# Patient Record
Sex: Female | Born: 1965 | Hispanic: Yes
Health system: Northeastern US, Academic
[De-identification: ages and names within clinical notes are randomized; demographics above are authoritative.]

---

## 2013-08-24 ENCOUNTER — Emergency Department (HOSPITAL_COMMUNITY): Payer: No Typology Code available for payment source

## 2013-08-24 ENCOUNTER — Emergency Department (HOSPITAL_COMMUNITY)
Admission: EM | Admit: 2013-08-24 | Discharge: 2013-08-24 | Disposition: A | Discharge status: Home / Self Care | Payer: No Typology Code available for payment source | Attending: Emergency Medicine | Admitting: Emergency Medicine

## 2013-08-24 ENCOUNTER — Encounter (HOSPITAL_COMMUNITY): Payer: Self-pay | Admitting: *Deleted

## 2013-08-24 DIAGNOSIS — R209 Unspecified disturbances of skin sensation: Secondary | ICD-10-CM | POA: Insufficient documentation

## 2013-08-24 DIAGNOSIS — M549 Dorsalgia, unspecified: Secondary | ICD-10-CM

## 2013-08-24 DIAGNOSIS — R51 Headache: Secondary | ICD-10-CM | POA: Insufficient documentation

## 2013-08-24 DIAGNOSIS — M7989 Other specified soft tissue disorders: Secondary | ICD-10-CM | POA: Insufficient documentation

## 2013-08-24 DIAGNOSIS — M545 Low back pain, unspecified: Secondary | ICD-10-CM | POA: Insufficient documentation

## 2013-08-24 DIAGNOSIS — M25539 Pain in unspecified wrist: Secondary | ICD-10-CM | POA: Insufficient documentation

## 2013-08-24 DIAGNOSIS — Y939 Activity, unspecified: Secondary | ICD-10-CM | POA: Insufficient documentation

## 2013-08-24 DIAGNOSIS — Y92410 Unspecified street and highway as the place of occurrence of the external cause: Secondary | ICD-10-CM | POA: Insufficient documentation

## 2013-08-24 MED ORDER — METHOCARBAMOL 500 MG PO TABS: 500.0000 mg | ORAL_TABLET | Freq: Two times a day (BID) | ORAL | Status: AC | PRN

## 2013-08-24 MED ORDER — HYDROCODONE-ACETAMINOPHEN 5-325 MG PO TABS: 1.0000 | ORAL_TABLET | ORAL | Status: AC | PRN

## 2013-08-24 NOTE — ED Provider Notes (Signed)
Medical screening examination/treatment/procedure(s) were performed by non-physician practitioner and as supervising physician I was immediately available for consultation/collaboration.  Flint MelterElliott L Kentley Blyden, MD 08/24/13 [phone]

## 2013-08-24 NOTE — Discharge Instructions (Signed)
Take the prescribed medication as directed.  Do not drive while taking vicodin. You will likely continue to be sore for the next several days-- this is normal.  Wear wrist splint as directed, may adjust for comfort. You may wish to ice and elevate wrist to help with pain and swelling.  Follow up with hand surgery, Dr. Amanda PeaGramig if no improvement of wrist symptoms within 1 week. Return to the ED for new or worsening symptoms.
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
## 2020-01-18 ENCOUNTER — Inpatient Hospital Stay: Admit: 2020-01-18 | Discharge: 2020-01-19 | Payer: MEDICAID

## 2020-01-18 ENCOUNTER — Encounter: Admit: 2020-01-18 | Payer: PRIVATE HEALTH INSURANCE | Primary: Internal Medicine

## 2020-01-18 ENCOUNTER — Emergency Department: Admit: 2020-01-18 | Payer: PRIVATE HEALTH INSURANCE | Primary: Internal Medicine

## 2020-01-18 DIAGNOSIS — E669 Obesity, unspecified: Secondary | ICD-10-CM

## 2020-01-18 DIAGNOSIS — R102 Pelvic and perineal pain: Secondary | ICD-10-CM

## 2020-01-18 DIAGNOSIS — F17200 Nicotine dependence, unspecified, uncomplicated: Secondary | ICD-10-CM

## 2020-01-18 DIAGNOSIS — G43909 Migraine, unspecified, not intractable, without status migrainosus: Secondary | ICD-10-CM

## 2020-01-18 DIAGNOSIS — K0889 Other specified disorders of teeth and supporting structures: Principal | ICD-10-CM

## 2020-01-18 DIAGNOSIS — I8390 Asymptomatic varicose veins of unspecified lower extremity: Secondary | ICD-10-CM

## 2020-01-18 DIAGNOSIS — N920 Excessive and frequent menstruation with regular cycle: Secondary | ICD-10-CM

## 2020-01-18 MED ORDER — IBUPROFEN 800 MG TABLET
800 mg | Freq: Once | ORAL | Status: CP
Start: 2020-01-18 — End: ?
  Administered 2020-01-19: 01:00:00 800 mg via ORAL

## 2020-01-18 MED ORDER — CEPHALEXIN 500 MG CAPSULE
500 mg | Freq: Once | ORAL | Status: CP
Start: 2020-01-18 — End: ?
  Administered 2020-01-19: 04:00:00 500 mg via ORAL

## 2020-01-18 MED ORDER — CEPHALEXIN 500 MG CAPSULE
500 mg | ORAL_CAPSULE | Freq: Three times a day (TID) | ORAL | 1 refills | Status: AC
Start: 2020-01-18 — End: ?

## 2020-01-19 DIAGNOSIS — F17210 Nicotine dependence, cigarettes, uncomplicated: Secondary | ICD-10-CM

## 2020-01-19 DIAGNOSIS — R220 Localized swelling, mass and lump, head: Secondary | ICD-10-CM

## 2020-01-19 DIAGNOSIS — Z6841 Body Mass Index (BMI) 40.0 and over, adult: Secondary | ICD-10-CM

## 2020-01-19 DIAGNOSIS — H9202 Otalgia, left ear: Secondary | ICD-10-CM

## 2020-01-19 DIAGNOSIS — E669 Obesity, unspecified: Secondary | ICD-10-CM

## 2020-01-19 LAB — BASIC METABOLIC PANEL
BKR ANION GAP: 14 (ref 7–17)
BKR BLOOD UREA NITROGEN: 14 mg/dL (ref 6–20)
BKR BUN / CREAT RATIO: 12.7 (ref 8.0–23.0)
BKR CALCIUM: 9.4 mg/dL (ref 8.8–10.2)
BKR CHLORIDE: 98 mmol/L (ref 98–107)
BKR CO2: 26 mmol/L (ref 20–30)
BKR CREATININE: 1.1 mg/dL (ref 0.40–1.30)
BKR EGFR (AFR AMER): >60 mL/min/{1.73_m2} (ref >60)
BKR EGFR (NON AFRICAN AMERICAN): 52 mL/min/{1.73_m2} (ref >60)
BKR GLUCOSE: 135 mg/dL — ABNORMAL HIGH (ref 70–100)
BKR POTASSIUM: 3.6 mmol/L (ref 3.3–5.1)
BKR SODIUM: 138 mmol/L (ref 136–144)

## 2020-01-19 LAB — CBC WITH AUTO DIFFERENTIAL
BKR WAM ABSOLUTE IMMATURE GRANULOCYTES: 0.1 x 1000/ÂµL (ref 0.0–0.3)
BKR WAM ABSOLUTE LYMPHOCYTE COUNT: 2.4 x 1000/ÂµL (ref 1.0–4.0)
BKR WAM ABSOLUTE NRBC: 0 x 1000/ÂµL (ref 0.0–0.0)
BKR WAM ANALYZER ANC: 6.5 x 1000/ÂµL (ref 1.0–11.0)
BKR WAM BASOPHIL ABSOLUTE COUNT: 0 x 1000/ÂµL (ref 0.0–0.0)
BKR WAM BASOPHILS: 0.3 % (ref 0.0–4.0)
BKR WAM EOSINOPHIL ABSOLUTE COUNT: 0.2 x 1000/ÂµL (ref 0.0–1.0)
BKR WAM EOSINOPHILS: 2.4 % (ref 0.0–7.0)
BKR WAM HEMATOCRIT: 44 % (ref 37.0–52.0)
BKR WAM HEMOGLOBIN: 14.5 g/dL (ref 12.0–18.0)
BKR WAM IMMATURE GRANULOCYTES: 0.6 % (ref 0.0–3.0)
BKR WAM LYMPHOCYTES: 23.8 % (ref 8.0–49.0)
BKR WAM MCH (PG): 30.5 pg (ref 27.0–31.0)
BKR WAM MCHC: 33 g/dL (ref 31.0–36.0)
BKR WAM MCV: 92.4 fL (ref 78.0–94.0)
BKR WAM MONOCYTE ABSOLUTE COUNT: 0.7 x 1000/ÂµL (ref 0.0–2.0)
BKR WAM MONOCYTES: 6.7 % (ref 4.0–15.0)
BKR WAM MPV: 9.7 fL (ref 6.0–11.0)
BKR WAM NEUTROPHILS: 66.2 % (ref 37.0–84.0)
BKR WAM NUCLEATED RED BLOOD CELLS: 0 % (ref 0.0–1.0)
BKR WAM PLATELETS: 282 x1000/ÂµL (ref 140–440)
BKR WAM RDW-CV: 13.3 % (ref 11.5–14.5)
BKR WAM RED BLOOD CELL COUNT: 4.8 M/ÂµL (ref 3.8–5.9)
BKR WAM WHITE BLOOD CELL COUNT: 9.9 x1000/ÂµL (ref 4.0–10.0)

## 2020-01-19 NOTE — ED Triage Note
Provider in Triage Note10 y.o. year old female  presents with tooth pain, facial pain, otalgia, left sided facial swelling for 3 days.  AfebrilePhysical Exam: obvious left sided facial swelling, erythema/indurationOrders placed in triage: yes imaging, labs Disposition: Main Emergency Department for further evaluation.Janet Escalante Koziel6/22/2021 8:30 PM

## 2020-01-19 NOTE — ED Notes
9:09 PM Pt presents to ED r/t left sided dental pain/swelling x 3 days. Pt is a+o X4, speaking in clear and full sentences. Pt reports she has a wisdom tooth on her upper left gum that has been causing her pain and swelling. Pt reports she bit down on a chicken bone and had extreme pain. Pt presents with left sided facial pain and left sided ear pain. Pt reports her left ear feels like she is underwater. Pt denies fevers/chills. Pt medicated per MAR. Pending provider eval.Past Medical History: Diagnosis Date ? Menorrhagia  ? Migraine headache  ? Obesity (BMI 30-39.9)  ? Pelvic pain   since 2010 or 2011 ? Smoker  ? Varicose vein of leg  9:34 PM Pt aware of pending [facility] scan9:48 PM MD at bedside for eval10:38 PM Pt back from [place], pending results

## 2020-01-28 NOTE — ED Provider Notes
HistoryChief Complaint Patient presents with ? Dental Problem   Presents to ED for eval of left sided dental pain and ear pain for 3 days. + facial swelling. Denies fever. ? Facial Swelling  The history is provided by the patient. OtherThis is a new problem. The current episode started more than 2 days ago. The problem occurs constantly. The problem has not changed since onset.Pertinent negatives include no chest pain and no abdominal pain. Nothing aggravates the symptoms. Nothing relieves the symptoms. She has tried nothing for the symptoms.  Past Medical History: Diagnosis Date ? Menorrhagia  ? Migraine headache  ? Obesity (BMI 30-39.9)  ? Pelvic pain   since 2010 or 2011 ? Smoker  ? Varicose vein of leg  Past Surgical History: Procedure Laterality Date ? BACK SURGERY   ? HYSTERECTOMY   ? LEFT OOPHORECTOMY    laparoscopy, ovarian torsion ? LUMBAR FUSION  09/11/2005 Family History Problem Relation Age of Onset ? Prostate cancer Father       metastasis to bone ? Breast cancer Neg Hx  ? Colon cancer Neg Hx  ? Ovarian cancer Neg Hx  Social History Socioeconomic History ? Marital status: Legally Separated   Spouse name: Not on file ? Number of children: Not on file ? Years of education: Not on file ? Highest education level: Not on file Tobacco Use ? Smoking status: Current Every Day Smoker   Packs/day: 1.50   Years: 35.00   Pack years: 52.50 ? Tobacco comment: recently on nicotene patch   still has about 2 cigaretters per day Substance and Sexual Activity ? Alcohol use: No ? Drug use: Yes   Types: Heroin, Cocaine   Comment: clean x 16 years  ED Other Social History ? Alchohol Risk Screen Positive if >/=0 Negative  ? Drug Risk Screen Positive if >/=0 Negative  E-cigarette/Vaping Substances E-cigarette/Vaping Devices Review of Systems Cardiovascular: Negative for chest pain. Gastrointestinal: Negative for abdominal pain. All other systems reviewed and are negative. Physical ExamED Triage Vitals [01/18/20 2031]BP: (!) 155/82Pulse: (!) 100Pulse from  O2 sat: n/aResp: 16Temp: 98.7 ?F (37.1 ?C)Temp src: OralSpO2: 99 % BP (!) 142/90  - Pulse 80  - Temp 97.5 ?F (36.4 ?C) (Oral)  - Resp 18  - Wt 123 kg  - LMP 10/14/2012  - SpO2 96%  - BMI 43.77 kg/m? Physical ExamVitals signs and nursing note reviewed. Constitutional:     General: She is not in acute distress.   Appearance: She is well-developed. She is not diaphoretic. HENT:    Head: Normocephalic and atraumatic.    Jaw: Tenderness present. Eyes:    Pupils: Pupils are equal, round, and reactive to light. Neck:    Musculoskeletal: Normal range of motion and neck supple. Cardiovascular:    Rate and Rhythm: Normal rate and regular rhythm.    Heart sounds: Normal heart sounds. Pulmonary:    Effort: Pulmonary effort is normal. No respiratory distress.    Breath sounds: Normal breath sounds. No wheezing. Abdominal:    General: Bowel sounds are normal. There is no distension.    Palpations: Abdomen is soft.    Tenderness: There is no abdominal tenderness. There is no guarding or rebound. Musculoskeletal: Normal range of motion.       General: No tenderness. Skin:   General: Skin is warm and dry. Neurological:    Mental Status: She is alert and oriented to person, place, and time.    Cranial Nerves: No cranial nerve deficit. Psychiatric:  Behavior: Behavior normal.  ProceduresProcedures ED COURSEInterpreted by ED Provider: pulse oximetryPatient Reevaluation: Pt with migraines, obesity, here with left facial pain, left ear pain for the past 3 days. No f/c. No previous episodes. Triage note says dental pain but pt denies. Said her teeth feel ok. No lesions,. Rash. Area over mid cheek slightly red but tender to touch. TMs--wnl bil. No dental/gingival tenderness. Diff dX: parotitis, odotogenic abscess/onfection, facial cellulitis less likleyPlan: labs, [place] facial bones ordered in Lawernce Pitts, MDNo abscess noted. Will treat for parotitis and dcClinical Impressions as of Jan 26 2302 Left facial swelling  ED DispositionDischarge Rosana Berger, MD06/23/21 0001 Rosana Berger, MD07/01/21 2303

## 2020-06-05 ENCOUNTER — Ambulatory Visit
Admit: 2020-06-05 | Payer: PRIVATE HEALTH INSURANCE | Attending: Student in an Organized Health Care Education/Training Program | Primary: Internal Medicine

## 2020-06-05 ENCOUNTER — Inpatient Hospital Stay: Admit: 2020-06-05 | Discharge: 2020-06-05 | Payer: MEDICAID | Primary: Internal Medicine

## 2020-06-05 DIAGNOSIS — H53411 Scotoma involving central area, right eye: Secondary | ICD-10-CM

## 2020-06-06 ENCOUNTER — Encounter: Admit: 2020-06-06 | Payer: PRIVATE HEALTH INSURANCE | Attending: Internal Medicine | Primary: Internal Medicine

## 2020-06-06 ENCOUNTER — Inpatient Hospital Stay: Admit: 2020-06-06 | Payer: PRIVATE HEALTH INSURANCE

## 2020-06-06 ENCOUNTER — Emergency Department: Admit: 2020-06-06 | Payer: PRIVATE HEALTH INSURANCE | Primary: Internal Medicine

## 2020-06-06 ENCOUNTER — Inpatient Hospital Stay: Admit: 2020-06-06 | Discharge: 2020-06-07 | Payer: MEDICAID | Primary: Internal Medicine

## 2020-06-06 DIAGNOSIS — F17200 Nicotine dependence, unspecified, uncomplicated: Secondary | ICD-10-CM

## 2020-06-06 DIAGNOSIS — N920 Excessive and frequent menstruation with regular cycle: Secondary | ICD-10-CM

## 2020-06-06 DIAGNOSIS — H5461 Unqualified visual loss, right eye, normal vision left eye: Secondary | ICD-10-CM

## 2020-06-06 DIAGNOSIS — I8390 Asymptomatic varicose veins of unspecified lower extremity: Secondary | ICD-10-CM

## 2020-06-06 DIAGNOSIS — E669 Obesity, unspecified: Secondary | ICD-10-CM

## 2020-06-06 DIAGNOSIS — R102 Pelvic and perineal pain: Secondary | ICD-10-CM

## 2020-06-06 DIAGNOSIS — G43909 Migraine, unspecified, not intractable, without status migrainosus: Secondary | ICD-10-CM

## 2020-06-06 LAB — BASIC METABOLIC PANEL
BKR ANION GAP: 13 (ref 7–17)
BKR BLOOD UREA NITROGEN: 10 mg/dL (ref 6–20)
BKR BUN / CREAT RATIO: 9.1 % (ref 8.0–23.0)
BKR CALCIUM: 9.6 mg/dL (ref 8.8–10.2)
BKR CHLORIDE: 101 mmol/L (ref 98–107)
BKR CO2: 27 mmol/L (ref 20–30)
BKR CREATININE: 1.1 mg/dL (ref 0.40–1.30)
BKR EGFR (AFR AMER): >60 mL/min/1.73m2 (ref >60)
BKR EGFR (NON AFRICAN AMERICAN): 52 mL/min/1.73m2 (ref >60)
BKR GLUCOSE: 95 mg/dL (ref 70–100)
BKR POTASSIUM: 4.2 mmol/L (ref 3.3–5.3)
BKR SODIUM: 141 mmol/L (ref 136–144)
BKR TREPONEMA PALLIDUM ANTIBODY TOTAL, SERUM: 9.6 mg/dL (ref 8.8–10.2)

## 2020-06-06 LAB — CBC WITH AUTO DIFFERENTIAL
BKR WAM ABSOLUTE IMMATURE GRANULOCYTES.: 0.05 x 1000/??L (ref 0.00–0.30)
BKR WAM ABSOLUTE LYMPHOCYTE COUNT.: 2.24 x 1000/??L (ref 0.60–3.70)
BKR WAM ABSOLUTE NRBC (2 DEC): 0 x 1000/??L (ref 0.00–1.00)
BKR WAM ANALYZER ANC: 7.3 x 1000/??L (ref 2.00–7.60)
BKR WAM BASOPHIL ABSOLUTE COUNT.: 0.04 x 1000/??L (ref 0.00–1.00)
BKR WAM BASOPHILS: 0.4 % (ref 0.0–1.4)
BKR WAM EOSINOPHIL ABSOLUTE COUNT.: 0.26 x 1000/??L (ref 0.00–1.00)
BKR WAM EOSINOPHILS: 2.5 % (ref 0.0–5.0)
BKR WAM HEMATOCRIT (2 DEC): 43.6 % (ref 35.00–45.00)
BKR WAM HEMOGLOBIN: 14.3 g/dL (ref 11.7–15.5)
BKR WAM IMMATURE GRANULOCYTES: 0.5 % (ref 0.0–1.0)
BKR WAM LYMPHOCYTES: 21.2 % (ref 17.0–50.0)
BKR WAM MCH (PG): 29.5 pg (ref 27.0–33.0)
BKR WAM MCHC: 32.8 g/dL (ref 31.0–36.0)
BKR WAM MCV: 89.9 fL (ref 80.0–100.0)
BKR WAM MONOCYTE ABSOLUTE COUNT.: 0.7 x 1000/??L (ref 0.00–1.00)
BKR WAM MONOCYTES: 6.6 % (ref 4.0–12.0)
BKR WAM MPV: 9.5 fL (ref 8.0–12.0)
BKR WAM NEUTROPHILS: 68.8 % (ref 39.0–72.0)
BKR WAM NUCLEATED RED BLOOD CELLS: 0 % (ref 0.0–1.0)
BKR WAM PLATELETS: 335 x1000/ÂµL (ref 150–420)
BKR WAM RDW-CV: 13.7 % (ref 11.0–15.0)
BKR WAM RED BLOOD CELL COUNT.: 4.85 M/??L (ref 4.00–6.00)
BKR WAM WHITE BLOOD CELL COUNT: 10.6 x1000/??L (ref 4.0–11.0)

## 2020-06-06 LAB — PT/INR AND PTT (BH GH L LMW YH)
BKR INR: 0.95 (ref 0.87–1.13)
BKR PARTIAL THROMBOPLASTIN TIME: 28.5 seconds — ABNORMAL HIGH (ref 22.5–32.0)
BKR PROTHROMBIN TIME: 10.3 seconds (ref 9.5–12.1)

## 2020-06-06 LAB — C-REACTIVE PROTEIN     (CRP): BKR C-REACTIVE PROTEIN, HIGH SENSITIVITY: 48.6 mg/L — ABNORMAL HIGH

## 2020-06-06 LAB — ANGIOTENSIN CONVERTING ENZYME: BKR ANGIOTENSIN CONVERTING ENZYME: 15 U/L (ref 12–82)

## 2020-06-06 MED ORDER — ASPIRIN-ACETAMINOPHEN-CAFFEINE 250 MG-250 MG-65 MG TABLET
250-250-65 mg | Freq: Every day | ORAL | Status: AC | PRN
Start: 2020-06-06 — End: ?

## 2020-06-06 MED ORDER — MELATONIN 3 MG TABLET
3 mg | Freq: Every evening | ORAL | Status: DC | PRN
Start: 2020-06-06 — End: 2020-06-07

## 2020-06-06 MED ORDER — GADOTERATE MEGLUMINE 0.5 MMOL/ML (376.9 MG/ML) INTRAVENOUS SOLUTION
0.5 mmol/mL (376.9 mg/mL) | Freq: Once | INTRAVENOUS | Status: CP | PRN
Start: 2020-06-06 — End: ?
  Administered 2020-06-06: 22:00:00 0.5 mL via INTRAVENOUS

## 2020-06-06 MED ORDER — ALBUTEROL SULFATE HFA 90 MCG/ACTUATION AEROSOL INHALER
90 mcg/actuation | RESPIRATORY_TRACT | Status: DC | PRN
Start: 2020-06-06 — End: 2020-06-07

## 2020-06-06 MED ORDER — ACETAMINOPHEN 325 MG TABLET
325 mg | Freq: Four times a day (QID) | ORAL | Status: DC | PRN
Start: 2020-06-06 — End: 2020-06-07

## 2020-06-06 MED ORDER — DIPHENHYDRAMINE 25 MG CAPSULE
25 mg | Freq: Once | ORAL | Status: CP
Start: 2020-06-06 — End: ?
  Administered 2020-06-07: 02:00:00 25 mg via ORAL

## 2020-06-06 MED ORDER — IOHEXOL 350 MG IODINE/ML INTRAVENOUS SOLUTION
350 mg iodine/mL | Freq: Once | INTRAVENOUS | Status: CP | PRN
Start: 2020-06-06 — End: ?
  Administered 2020-06-07: 03:00:00 350 mL via INTRAVENOUS

## 2020-06-06 MED ORDER — METOCLOPRAMIDE 10 MG TABLET
10 mg | Freq: Every day | ORAL | Status: DC | PRN
Start: 2020-06-06 — End: 2020-06-07

## 2020-06-06 MED ORDER — NICOTINE 21 MG/24 HR DAILY TRANSDERMAL PATCH
21 mg | Freq: Every day | TRANSDERMAL | Status: DC
Start: 2020-06-06 — End: 2020-06-07

## 2020-06-06 MED ORDER — SODIUM CHLORIDE 0.9 % (FLUSH) INJECTION SYRINGE
0.9 % | Freq: Three times a day (TID) | INTRAVENOUS | Status: DC
Start: 2020-06-06 — End: 2020-06-07

## 2020-06-06 MED ORDER — ADVIL DUAL ACTION 125 MG-250 MG TABLET
125-250 mg | Freq: Three times a day (TID) | ORAL | Status: AC | PRN
Start: 2020-06-06 — End: ?

## 2020-06-06 MED ORDER — ACETAMINOPHEN ER 650 MG TABLET,EXTENDED RELEASE
650 mg | Freq: Three times a day (TID) | ORAL | Status: AC | PRN
Start: 2020-06-06 — End: ?

## 2020-06-06 MED ORDER — SODIUM CHLORIDE 0.9 % (FLUSH) INJECTION SYRINGE
0.9 % | INTRAVENOUS | Status: DC | PRN
Start: 2020-06-06 — End: 2020-06-07

## 2020-06-06 MED ORDER — DIPHENHYDRAMINE 25 MG-ACETAMINOPHEN 500 MG TABLET
25-500 mg | Freq: Every evening | ORAL | Status: AC | PRN
Start: 2020-06-06 — End: ?

## 2020-06-06 MED ORDER — SODIUM CHLORIDE 0.9 % BOLUS (NEW BAG)
0.9 % | Freq: Once | INTRAVENOUS | Status: CP
Start: 2020-06-06 — End: ?
  Administered 2020-06-07: 03:00:00 0.9 mL/h via INTRAVENOUS

## 2020-06-07 ENCOUNTER — Inpatient Hospital Stay: Admit: 2020-06-07 | Discharge: 2020-06-07 | Payer: MEDICAID | Primary: Internal Medicine

## 2020-06-07 ENCOUNTER — Ambulatory Visit: Admit: 2020-06-07 | Payer: PRIVATE HEALTH INSURANCE | Primary: Internal Medicine

## 2020-06-07 ENCOUNTER — Encounter: Admit: 2020-06-07 | Payer: PRIVATE HEALTH INSURANCE | Primary: Internal Medicine

## 2020-06-07 DIAGNOSIS — Z6841 Body Mass Index (BMI) 40.0 and over, adult: Secondary | ICD-10-CM

## 2020-06-07 DIAGNOSIS — Z20822 Contact with and (suspected) exposure to covid-19: Secondary | ICD-10-CM

## 2020-06-07 DIAGNOSIS — E669 Obesity, unspecified: Secondary | ICD-10-CM

## 2020-06-07 DIAGNOSIS — Z91013 Allergy to seafood: Secondary | ICD-10-CM

## 2020-06-07 DIAGNOSIS — Z6830 Body mass index (BMI) 30.0-30.9, adult: Secondary | ICD-10-CM

## 2020-06-07 DIAGNOSIS — Z79899 Other long term (current) drug therapy: Secondary | ICD-10-CM

## 2020-06-07 DIAGNOSIS — Z91041 Radiographic dye allergy status: Secondary | ICD-10-CM

## 2020-06-07 DIAGNOSIS — G43909 Migraine, unspecified, not intractable, without status migrainosus: Secondary | ICD-10-CM

## 2020-06-07 DIAGNOSIS — Z880 Allergy status to penicillin: Secondary | ICD-10-CM

## 2020-06-07 DIAGNOSIS — F17210 Nicotine dependence, cigarettes, uncomplicated: Secondary | ICD-10-CM

## 2020-06-07 DIAGNOSIS — R519 Headache, unspecified: Secondary | ICD-10-CM

## 2020-06-07 DIAGNOSIS — H5461 Unqualified visual loss, right eye, normal vision left eye: Secondary | ICD-10-CM

## 2020-06-07 DIAGNOSIS — G43109 Migraine with aura, not intractable, without status migrainosus: Secondary | ICD-10-CM

## 2020-06-07 DIAGNOSIS — Z7982 Long term (current) use of aspirin: Secondary | ICD-10-CM

## 2020-06-07 DIAGNOSIS — I8390 Asymptomatic varicose veins of unspecified lower extremity: Secondary | ICD-10-CM

## 2020-06-07 DIAGNOSIS — J45909 Unspecified asthma, uncomplicated: Secondary | ICD-10-CM

## 2020-06-07 DIAGNOSIS — Z86718 Personal history of other venous thrombosis and embolism: Secondary | ICD-10-CM

## 2020-06-07 DIAGNOSIS — F17200 Nicotine dependence, unspecified, uncomplicated: Secondary | ICD-10-CM

## 2020-06-07 DIAGNOSIS — R7982 Elevated C-reactive protein (CRP): Secondary | ICD-10-CM

## 2020-06-07 DIAGNOSIS — N920 Excessive and frequent menstruation with regular cycle: Secondary | ICD-10-CM

## 2020-06-07 DIAGNOSIS — R102 Pelvic and perineal pain: Secondary | ICD-10-CM

## 2020-06-07 DIAGNOSIS — R700 Elevated erythrocyte sedimentation rate: Secondary | ICD-10-CM

## 2020-06-07 LAB — TREPONEMA PALLIDUM (SYPHILIS) ANTIBODY W/REFLEX: BKR TREPONEMA PALLIDUM ANTIBODY INITIAL RESULT: 0.531 {index}

## 2020-06-07 LAB — SARS COV-2 (COVID-19) RNA-~~LOC~~ LABS (BH GH LMW YH)
BKR SARS-COV-2 RNA (COVID-19) (YH): NEGATIVE
BKR SARS-COV-2 RNA (COVID-19) (YH): NEGATIVE

## 2020-06-07 LAB — ANA BY IFA W/RFLX TO DSDNA, RNP, SM, SSA, AND SSB ABS     (YH): BKR ANTI-NUCLEAR ANTIBODY (ANA): <1:80 {titer}

## 2020-06-07 MED ORDER — SODIUM CHLORIDE 0.9 % (FLUSH) INJECTION SYRINGE
0.9 % | INTRAVENOUS | Status: DC | PRN
Start: 2020-06-07 — End: 2020-06-08

## 2020-06-07 MED ORDER — ENOXAPARIN 40 MG/0.4 ML SUBCUTANEOUS SYRINGE
40 mg/0.4 mL | Freq: Two times a day (BID) | SUBCUTANEOUS | Status: DC
Start: 2020-06-07 — End: 2020-06-08

## 2020-06-07 MED ORDER — ENOXAPARIN 40 MG/0.4 ML SUBCUTANEOUS SYRINGE
40 mg/0.4 mL | SUBCUTANEOUS | Status: DC
Start: 2020-06-07 — End: 2020-06-07

## 2020-06-07 MED ORDER — SODIUM CHLORIDE 0.9 % (FLUSH) INJECTION SYRINGE
0.9 % | Freq: Three times a day (TID) | INTRAVENOUS | Status: DC
Start: 2020-06-07 — End: 2020-06-08

## 2020-06-07 MED ORDER — RIZATRIPTAN 5 MG DISINTEGRATING TABLET
5 mg | Freq: Once | Status: DC | PRN
Start: 2020-06-07 — End: 2020-06-08
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
## 2020-06-07 NOTE — ED Notes
2:37 PM pt walked in through triage after referral from eye doctor. Pt reports her doctor said he was concerned R/T potential infection when blood samples were obtained. Pt reports her right eye has blurred central peripheral vision x 2 months. Pt reports no history of DM, Stroke, and glaucoma. Pt denies trauma to her eye. Pt denies CP, SOB, N/V/D, fevers and HA. Neuro intact, speech clear, steady gait, A/Ox4. No further complaints. Past Medical History: Diagnosis Date ? Menorrhagia  ? Migraine headache  ? Obesity (BMI 30-39.9)  ? Pelvic pain   since 2010 or 2011 ? Smoker  ? Varicose vein of leg  3:23 PM pt resting in bed awaiting MRI, MRI screen completed. 4:12 PM pt resting in bed awaiting results.  4:50 PM pt at MRI. Awaiting results. 5:28 PM pt back from MRI awaiting results. 6:12 PM pt ambulated to BR with steady gait. 7:04 PM report given to oncoming RN, care transferred.

## 2020-06-07 NOTE — Other
-    CONSULT  REQUEST  DOCUMENTATION  -  [facility] NOTE  -  Type of consult: [facility] Rheumatology   -  New Consult: [id number] Lebron Quam Freda Jackson /Location: RM-C3/C3 / Brief Clinical Question: Painless R vision loss, concern for GCA/Callback Cell Phone: [phone] / Please confirm receipt of this message by texting back ?OK?  -  1 - Mobile Heartbeat message sent to [place], F at 1:18 PM. Received response at 1319.  Richelle Ito  06/07/2020  1:18 PM  Consult [facility] [phone]

## 2020-06-07 NOTE — Other
-    CONSULT  REQUEST  DOCUMENTATION  -  [facility] NOTE  -  Type of consult: [facility] Neurology   -  New Consult: [id number] Lebron Quam Freda Jackson /Location: RM-C3/C3 / Brief Clinical Question: R vision loss, old R BG infarct/Callback Cell Phone: [phone] Brief Clinical Question: R vision loss, old R BG infarct/Callback Cell Phone: [phone] / Please confirm receipt of this message by texting back ?OK?  -  1 - Mobile Heartbeat message sent to [place], L at 12:32 PM. Received response at 1244.  Richelle Ito  06/07/2020  12:32 PM  Consult [facility] [phone]

## 2020-06-07 NOTE — ED Notes
11:26 PM care assumed pt in NAD, VSS, awaiting admission bed 11:51 PM pt assessed.  PT states she doesn't want to wait anymore.  She is tired of waiting for her bed and would feel better leaving and coming back at another time. Pt appearing to get aggravated and frustrated.   Pt was advised that she should stay and leaving would be against medical advice.  Attending notified and ok with pt leaving AMA.  Pt appears in NAD, IV removed.
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
## 2020-06-07 NOTE — ED Notes
7:13 PM Janet Glenn, RNAssumed pt care, received report from previous RN. Past Medical History: Diagnosis Date ? Menorrhagia  ? Migraine headache  ? Obesity (BMI 30-39.9)  ? Pelvic pain   since 2010 or 2011 ? Smoker  ? Varicose vein of leg  10:26 PMReturned from [place], appears to be sleeping comfortably on stretcher, respirations even and unlabored, does not appear to be in any distress at this time, VSS, awaiting admit, wctm

## 2020-06-07 NOTE — Other
-    CONSULT  REQUEST  DOCUMENTATION  -  [facility] NOTE  -  Type of consult: [facility] Ophthalmology   -  New Consult: [id number] Lebron Quam Freda Jackson /Location: RM-C3/C3 / Brief Clinical Question: Painless R vision loss, ? GCA/Callback Cell Phone: [phone] / Please confirm receipt of this message by texting back ?OK?  -  2 - Smartweb Page sent to Ophthalmology [facility] [phone] at 1:35 PM.  -  Richelle Ito  06/07/2020  1:35 PM  Consult Connect [facility] [phone]
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
## 2020-06-07 NOTE — Other
Hospitalist Release from OBSERVATION Note Patient Data:  Patient Name: Janet Glenn Age: 54 y.o. DOB: [birth year]	 MRN: [id number]	 Admit date: 11/10/2021Discharge date: 11/10/2021Patient seen and examined.  Janet Glenn  is stable for release from OBSERVATION to home following this presentation for Visual loss in R eyePatient decided to leave AMA despite multiple discussions about need to stay at least overnight to complete work upAMA form signedBP 131/86  - Pulse 82  - Temp 98.1 ?F (36.7 ?C) (Oral)  - Resp 16  - LMP 10/14/2012  - SpO2 98% Brief Exam:See H&P noteCurrent Discharge Medication List  CONTINUE these medications which have NOT CHANGED  Details aspirin-acetaminophen-caffeine (EXCEDRIN MIGRAINE) 250-250-65 mg per tablet Take 1 tablet by mouth daily as needed (for migraine). Do not exceed 2 tablets in 24 hours  SUMAtriptan (IMITREX) 50 mg tablet Take 2 tablets (100 mg total) by mouth daily as needed for migraine (Take as soon as migraine starts together with reglan and tylenol, no more than 3 times per week or 9 times per month).Qty: 9 tablet, Refills: 5  Associated Diagnoses: Migraine with aura and without status migrainosus, not intractable  acetaminophen (TYLENOL) 650 mg CR tablet Take 650 mg by mouth every 8 (eight) hours as needed.  albuterol sulfate 90 mcg/actuation HFA aerosol inhaler Inhale 2 puffs into the lungs every 4 (four) hours as needed for shortness of breath or wheezing.Qty: 6.7 g, Refills: 2  diphenhydrAMINE-acetaminophen (TYLENOL PM) 25-500 mg Tab Take 1 tablet by mouth nightly as needed (for headache).  ibuprofen-acetaminophen (ADVIL DUAL ACTION) 125-250 mg Tab Take 2 tablets by mouth every 8 (eight) hours as needed (for migraine).  metoclopramide HCl (REGLAN) 10 mg tablet Take 1 tablet (10 mg total) by mouth daily as needed (Take as soon as migraine starts together with sumatriptan and tylenol, no more than 3 times per week or 9 times per month).Qty: 30 tablet, Refills: 3  Associated Diagnoses: Migraine with aura and without status migrainosus, not intractable  Miscellaneous Medical Supply Nebulizer machine. Use every 6 hours as needed, duration: 1 year. For asthma ICD10 J45Qty: 1 each, Refills: 0  nicotine (NICODERM CQ) 21 mg transdermal patch Place 1 patch onto the skin daily.   Pain medication prescribed:  NoneIndication for pain medications:  N/AFollow-up Information:No follow-up provider specified.PMD: Roderic Palau, MD11/10/20214:58 PM

## 2020-06-07 NOTE — Utilization Review (ED)
UM Status: UR/MD agree on OBS status   - Dr.  Burtis Junes YO presents with Headache.  Plan Includes - LabsPain Management

## 2020-06-07 NOTE — Other
Hospitalist Release from OBSERVATION Note Patient Data:  Patient Name: Janet Glenn Age: 54 y.o. DOB: Sep 20, 1965	 MRN: [id number]	 Admit date: 11/9/2021Discharge date: 11/9/2021Patient left against medical advice.Patient seen and examined.  This is a 54 year old female with no significant past medical history except migraines, developed painless visual loss in right eye which improved, seen by ophthalmologist and PMD, noted to have elevated CRP, sent to ED to rule out optic neuritis.  ED evaluation is unremarkable.  Had unremarkable MRI brain/orbits, CTA head and neck. BP (!) 147/84  - Pulse (!) 92  - Temp 98 ?F (36.7 ?C) (Oral)  - Resp 18  - Ht 5' 6 (1.676 m)  - Wt 117.9 kg  - LMP 10/14/2012  - SpO2 97%  - BMI 41.97 kg/m? Brief Exam:HEENT:  NADMorbidly obese.Lungs clear to auscultation.CVS:  Regular rhythm.Abdomen:  Obese abdominal wall, soft, bowel sounds present.Neuro:  Oriented x3 and decreased vision in right eye.Extremities no edema.Had discussion with patient regarding staying in the hospital.  But she refused . Current Discharge Medication List  CONTINUE these medications which have NOT CHANGED  Details acetaminophen (TYLENOL) 650 mg CR tablet Take 650 mg by mouth every 8 (eight) hours as needed.  aspirin-acetaminophen-caffeine (EXCEDRIN MIGRAINE) 250-250-65 mg per tablet Take 1 tablet by mouth daily as needed (for migraine). Do not exceed 2 tablets in 24 hours  diphenhydrAMINE-acetaminophen (TYLENOL PM) 25-500 mg Tab Take 1 tablet by mouth nightly as needed (for headache).  ibuprofen-acetaminophen (ADVIL DUAL ACTION) 125-250 mg Tab Take 2 tablets by mouth every 8 (eight) hours as needed (for migraine).  nicotine (NICODERM CQ) 21 mg transdermal patch Place 1 patch onto the skin daily.  albuterol sulfate 90 mcg/actuation HFA aerosol inhaler Inhale 2 puffs into the lungs every 4 (four) hours as needed for shortness of breath or wheezing.Qty: 6.7 g, Refills: 2  metoclopramide HCl (REGLAN) 10 mg tablet Take 1 tablet (10 mg total) by mouth daily as needed (Take as soon as migraine starts together with sumatriptan and tylenol, no more than 3 times per week or 9 times per month).Qty: 30 tablet, Refills: 3  Associated Diagnoses: Migraine with aura and without status migrainosus, not intractable  Miscellaneous Medical Supply Nebulizer machine. Use every 6 hours as needed, duration: 1 year. For asthma ICD10 J45Qty: 1 each, Refills: 0  SUMAtriptan (IMITREX) 50 mg tablet Take 2 tablets (100 mg total) by mouth daily as needed for migraine (Take as soon as migraine starts together with reglan and tylenol, no more than 3 times per week or 9 times per month).Qty: 9 tablet, Refills: 5  Associated Diagnoses: Migraine with aura and without status migrainosus, not intractable   Pain medication prescribed:  Indication for pain medications: Follow-up Information:No follow-up provider specified.PMD: Hardie Shackleton, MD11/10/202112:04 AM

## 2020-06-07 NOTE — Significant Event
Patient arrived to unit at 1618, stating she is not staying here and told numerous staff that she did not want to be admitted here because her father died in this hospital. Admitting attending MD E Bekui notified and coming to bedside to speak to patient. 1643: MD Bekui at bedside, reviewed patient risks of leaving AMA. Patient verbalized understanding , signed AMA paperwork, PIV access removed, patient walked off unit immediately.

## 2020-06-07 NOTE — Utilization Review (ED)
UM Status: UR/MD agree on OBS status  PA JulianCase discussed with PA Jacquenette Shone, ok to start as observation status.Patient with elevated CRP and vision changes, no acute intervention from Ophthalmology, neurology following for echo and CTA stroke work-up.

## 2020-06-07 NOTE — ED Provider Notes
HistoryChief Complaint Patient presents with ? Eye Problem   pt left AMA concerned about vehicle parked outside the ED and now presents back for readmission  The history is provided by the patient. HeadacheChronicity: With visual changes was seen here yesterday and admitted left AMA. Episode onset: Days. The problem occurs constantly. Associated symptoms include headaches. Nothing aggravates the symptoms. Nothing relieves the symptoms. She has tried nothing for the symptoms.  Past Medical History: Diagnosis Date ? Menorrhagia  ? Migraine headache  ? Obesity (BMI 30-39.9)  ? Pelvic pain   since 2010 or 2011 ? Smoker  ? Varicose vein of leg  Past Surgical History: Procedure Laterality Date ? BACK SURGERY   ? HYSTERECTOMY   ? LEFT OOPHORECTOMY    laparoscopy, ovarian torsion ? LUMBAR FUSION  09/11/2005 Family History Problem Relation Age of Onset ? Prostate cancer Father       metastasis to bone ? Breast cancer Neg Hx  ? Colon cancer Neg Hx  ? Ovarian cancer Neg Hx  Social History Socioeconomic History ? Marital status: Legally Separated   Spouse name: Not on file ? Number of children: Not on file ? Years of education: Not on file ? Highest education level: Not on file Tobacco Use ? Smoking status: Current Every Day Smoker   Packs/day: 1.50   Years: 35.00   Pack years: 52.50 ? Tobacco comment: recently on nicotene patch   still has about 2 cigaretters per day Vaping Use ? Vaping Use: Never used Substance and Sexual Activity ? Alcohol use: No ? Drug use: Yes   Types: Heroin, Cocaine   Comment: clean x 16 years  ? Sexual activity: Not Currently ED Other Social History ? E-cigarette status Never User  ? E-Cigarette Use Never User  ? Alchohol Risk Screen Positive if >/=0 Negative  ? Drug Risk Screen Positive if >/=0 Negative  E-cigarette/Vaping Substances E-cigarette/Vaping Devices Review of Systems Constitutional: Negative.  HENT: Negative.  Eyes: Negative.  Respiratory: Negative.  Cardiovascular: Negative.  Gastrointestinal: Negative.  Endocrine: Negative.  Genitourinary: Negative.  Musculoskeletal: Negative.  Allergic/Immunologic: Negative.  Neurological: Positive for headaches. Hematological: Negative.  Psychiatric/Behavioral: Negative.  All other systems reviewed and are negative. Physical ExamED Triage Vitals [06/07/20 0821]BP: (!) 147/87Pulse: 90Pulse from  O2 sat: n/aResp: 18Temp: 98 ?F (36.7 ?C)Temp src: TemporalSpO2: 99 % BP (!) 147/87  - Pulse 90  - Temp 98 ?F (36.7 ?C) (Temporal)  - Resp 18  - LMP 10/14/2012  - SpO2 99% Physical ExamVitals and nursing note reviewed. Constitutional:     General: She is not in acute distress.   Appearance: She is well-developed. She is not diaphoretic. HENT:    Head: Normocephalic and atraumatic. Eyes:    General: No scleral icterus.      Right eye: No discharge.       Left eye: No discharge.    Conjunctiva/sclera: Conjunctivae normal. Cardiovascular:    Rate and Rhythm: Normal rate and regular rhythm.    Heart sounds: Normal heart sounds. No murmur heard. No friction rub. No gallop.  Pulmonary:    Effort: Pulmonary effort is normal. No respiratory distress.    Breath sounds: Normal breath sounds. No stridor. No wheezing or rales. Chest:    Chest wall: No tenderness. Abdominal:    General: Bowel sounds are normal. There is no distension.    Palpations: Abdomen is soft. There is no mass.    Tenderness: There is no abdominal tenderness. There is no guarding or rebound.    Hernia:  No hernia is present. Musculoskeletal:       General: No tenderness or deformity. Normal range of motion.    Cervical back: Normal range of motion and neck supple. Lymphadenopathy:    Cervical: No cervical adenopathy. Skin:   General: Skin is warm.    Capillary Refill: Capillary refill takes less than 2 seconds.    Coloration: Skin is not pale.    Findings: No erythema or rash. Neurological:    Mental Status: She is alert and oriented to person, place, and time.    Cranial Nerves: No cranial nerve deficit.    Sensory: No sensory deficit.    Motor: No abnormal muscle tone.    Coordination: Coordination normal.    Deep Tendon Reflexes: Reflexes normal. Psychiatric:       Behavior: Behavior normal.       Thought Content: Thought content normal.       Judgment: Judgment normal.  ProceduresProcedures ED COURSEPatient Reevaluation: Patient was in admission boarding in the ED and she left AMA because the cab on her 82 wheeler was park to [place] and she was worried it would get towed.  She has now returned to complete her admission.Patient progress: stableClinical Impressions as of Jun 07 830 Nonintractable headache, unspecified chronicity pattern, unspecified headache type  ED DispositionObservation Enis Gash IV, MD11/10/21 [phone]

## 2020-06-07 NOTE — Progress Notes
Spoke with ED/hospitalist provider regarding this patient. No further inpatient workup necessary from ophthalmology perspective. We will reach out to patient to schedule outpatient follow [facility] at [facility]. Franky Macho BarnardOphthalmology PGY-311/10/21

## 2020-06-07 NOTE — ED Notes
SOCIAL WORK ASSESSMENT    Patient Name: Janet Glenn    Medical Record Number: [id number]    Date of Birth: Oct 27, 1965    Social Work Assessment Adult      Most Recent Value   Rendered Accommodations (Leave blank if none rendered or patient supplied their own hearing devices/glasses)   Other language interpreter used (non-ASL)? No   Admission Information   Document Type Clinical  Assessment   (For Inpatient/ED Only) Prior psychosocial assessment has been documented within this hospitalization No   (For Inpatient/ED Only) Prior psychosocial assessment has been documented within 30 days of this hospitalization No   Reason for Encounter Psycho-Social Concerns, Resources   Visitor Restriction in Place No   Intervention Psycho-social Intervention   Psycho-social Intervention Problem-solving, Validation, Clarifying and Identifying   Source of Information Patient, Medical Team   Medical Team Comment Collaborated with medical team.   Record Reviewed Yes   Level of Care Emergency Department   Assessment has been completed within 30 days of this encounter   (For Inpatient/ED Only) Prior psychosocial assessment has been documented within 30 days of this hospitalization No   Patients Legal Contacts   Legal Custody Status Self      Custody Status Comment Not applicable.   Past/Current Department of Children and Families Involvement No   Legal Admission Status None      Legal Admission Status Comment  Not applicable.   Legal/Judicial Status None      Legal/Judicial Status Comment Not applicable.   Criminal Activity/Legal Involvement Pertinent to Current Situation/Hospitalization Unable to assess.   Currently on Probation / Parole? Deferred   Pending Court Dates, Warrants and/or Legal Charges Unable to assess.   Legal Contact(s)  none   Probate Court Granted Conservatorship No   Conservator Notified of Admission No   Legal   Permission Granted to Share Information  No   Legal Comment Unable to assess.   Language needed None, Patient Speaks English   Current Providers and Community Involvement   Current Providers and Community Involvement A-L None   Current Providers and Community Involvement M-Z unable to assess   Prior to admission comment Not applicable.   Relationships   Marital Status Single Adult   Significant Relationships Sister(s), Other Family Members   Family circumstances Ms. 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 She reports that she is able to stay with her sister, nieces, and nephews occasionally.   Quality of Family Relationships Supportive   Support System No concerns Noted   Separation/Losses (recent): No   Lives With Sister(s), Other Relative(s) (Specify)  [Nieces and nephew]   Sources of Support sibling(s), other family members   Sexual history pertinent to current situation/hospitalization none   Need for family/caregiver participation in care no   Primary Family Member / Social Support Expectations of Care Not applicable.   Temporary Family Living Arrangements (While Hospitalized) none needed   Relationship Comments Not applicable.   Abuse Screen (yes response referral indicated)   Able to respond to abuse questions Yes   Do you Feel That You Are Treated Well By Your Partner/Spouse/Family Member/Caregiver/Employer?  yes   What Happens When You Argue/Fight With Your Partner/Spouse/Family Member? Not applicable.   Feels Unsafe at Home or Work/School no   Feels Threatened by Someone no   Does Anyone Try to Keep You From Having Contact with Others or Doing Things Outside Your Home? no  Do you have concerns regarding someone you know having access to your MyChart account? no   Physical Signs of Abuse Present no   Mandated Referral   Mandated Report Required no   Mandated referral comment Not required at this time.   Abuse/Trauma History   Abuse/trauma history pertinent to current situation/hospitalization Unable to assess   Physical/Sexual Abuse Perpetration Unable to assess.   Other Sexually Reactive Behaviors Unable to assess.   Education - Adult   Education - Adult Unable to assess   Current Education Enrollment Unable to assess   Literacy Read/write independently   Special Needs None reported.   Employment/Income/Finance/Insurance   Research officer, trade union Unable to assess   Financial Concerns Identified Unable to Assess   Financial Barriers to accessing medical care  None   Employment Status Unable to Assess   Housing / Transportation/ Environment   Living Arrangements for the past 2 months Automobile, Apartment   Is the patient eligible for medical respite? No   No Needs warrant a lower level of care   Housing-Related Financial Concerns Unable to assess   Able to Return to Prior Arrangements yes   Able to Receive Homecare Services Unable to assess   Housing-Related Environmental Concerns No concerns   Has utility company threatened to shut off services? No   Food Availability No Concerns   Needs healthcare-related transportation No   Healthcare-related transportation concerns No   Parking charges are accruing No   Housing/Transportation/Environmental Comment Not applicable.   Mental Status   Observation of Mental Status has identified Notable Findings Unable to assess   History of Psychiatric Illness/Diagnosis Unable to assess.   Suicide Risk Assessment   Reason for Assessment Utilizing SAFE-T and C-SSRS (Check all that apply) Social Work Consult/Assessment   C-SSRS Unable to Assess   Specific Questions about Thoughts, Plans, Suicidal Intent (SAFE-T) Unable to Assess   Risk Assessment   Access to Lethal Methods?  (firearm in home or access/presence of other lethal methods) No   Risk Assessment Unable to Assess   Risk to Self Unable to Assess   Risk to Others Unable to Assess   Current and Past Psychiatric Diagnoses Unable to Assess   Presenting Symptoms Anxiety and/or Panic   Family History Unable to Assess   Precipitants/Stressors Chronic physical pain/Other acute medical, Housing insecurity, Stressful Life Events   Change in Mental Health or Substance Use Disorder Treatment Unable to Assess   Historical Risk Factors Unable to Assess   Protective Factors Unable to Assess   This patient was screened using the [place] Suicide Severity Rating Scale (CSSRS)  No   This patient was not screened using the [place] Suicide Severity Rating Scale (CSSRS)  Ms. Tonnette Zwiebel did not wish to fully engage in social work assessment and left the emergency department against medical advice.   Cause for concern Unable to Assess   Substance Use   Substances Used Unable to assess   Active substance abuse Unable to assess   Exposure to Second Hand Smoke other (see comments)  [Unable to assess.]   Previous Substance Use Treatment other (see comments)  [Unable to assess.]   Substance Use, Caregiver   Caregiver Substance Use Unable to Assess   FICA Spiritual Assessment Tool   Need for spiritual support  No   Permission to notify clergy No   Spiritual Care Comment Not applicable.   Needs Assessment    Concerns to be Addressed homelessness   Concerns Comments No additional concerns reported at  this time.   Readmission Within the Last 30 Days unable to assess   Needs in the Community homeless   Anticipated Facility/Agency/Outpatient/Support Group Need(s)  Infoline 2-1-1   Narrative/Signoff   Identified Clinical/Disposition, Issues/Barriers: Social work was consulted by off-shift executive Rosemarie Beath Hofe to assist Ms. Mischele Detter in towing her semi-trailer truck that is parked in [employer].   Intervention(s)/Summary 35 minutes spent face to face with Ms. Knox Saliva. Social work was consulted by off-shift executive Rosemarie Beath Hofe to assist Ms. Kynlie Jane in towing her semi-trailer truck that is parked in [employer]. Upon arrival at bedside, Ms. Darshay Deupree inquired about the reason for a social work consult. I informed her of the nature of the social work consult. Ms. Zephyra Bernardi presented as frustrated and anxious in the context of her present situation. She reports that her semi-trailer truck is her 'home' and she is anxious that it will be towed and she will not be able to return to it. I actively listened to Ms. Gwendlyon Zumbro' concerns and validated her. I offered to problem-solve and find a way to safely relocate the semi-trailer for her and ensure she has safe transportation back to the semi-trailer upon discharge to avoid a third party towing it or having her potentially be ticketed. Ms. Keymora Grillot became frustrated and stated that she does not wish for social work to be involved, and would rather leave against medical advice to return to the truck. I encouraged Ms. Knox Saliva to stay and receive medical treatment, and reassured her that we can figure out a solution for her semi-trailer. Ms. Alizzon Dioguardi acknowledged this and verbalized that she does not wish to further engage in social work assessment. I educated Ms. Knox Saliva on the 211 Infoline and encouraged her to utilize it. Ms. Mallie Giambra left against medical advice to return to her semi-trailer. No further social work intervention at this time. Please re-consult social work should additional needs arise.   Collaboration with Treatment Team/Community Providers/Family: Collaborated with medical team.   Referrals/Resources Provided: None   Outcome Resolved   Handoff Required? No   Next Steps/Plan (including hand-off): No further social work intervention at this time. Please re-consult social work should additional needs arise.   Signature: Vickey Sages, LMSW   Contact Information: [phone]

## 2020-06-08 NOTE — Other
Janet Glenn is a 54 y.o. female with ,  who is referred by Dr. Assunta Found for consultation with rheumatology. History of Present Illness:54 year old female with past medical history significant for migraines, active smoker presents with 1 month history of painless vision loss.  She started to have loss of vision in right eye when she was in [place]. She was noted to have elevated CRP and rheumatology consulted to rule out GCA.On history, patient denies any alopecia, dry eyes/mouth, mouth ulcers, arthralgias, weight changes.  She states no eye pain and has a history of chronic migraines with the last migraine presenting about 1 month ago.  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  ROS was found to be negative for jaw claudication, headaches, weight loss, fevers; positive only for left lower extremity weakness which also had onset while she was in [place].  Their differential included: non-arteritic anterior vs. posterior ischemic optic neuropathy, optic neuritis (less likely infectious given nonprogressive nature of vision loss), compressive lesion, less likely Giant Cell Arteritis.  They recommended chest Xray, MRI brain/orbits with and without IV contrast and Lab work: RPR, FTA-ABs, TB, ACE, ANA. No further ophthalmology inpatient work-up was necessary per their consult.Review of Systems (negative except as noted; positive findings bold)Constitutional: weight changes, fatigue, malaise, fever, chills, sweats Skin: rashes, photosensitivity, hives, easy bruisability, alopecia, scalp tenderness, skin nodules or psoriasis Eyes:  pain, redness, itching, visual blurring, dryness, foreign body sensation + vision lossENT: tinnitus, hearing loss, sinus congestion, loss of smell, dry nose, bloody nose, jaw claudication, oral ulcers, loss of taste, dry mouth, hoarsenessCardiac: chest pain, palpitations, irregular heartbeat, exertional dyspneaVascular: Raynaud?s, chilblains, frostbite, venous stasis, thrombosisPulmonary: shortness of breath, cough, wheeze, hemoptysis, chest wall pain, pleuritic chest pain, current tobacco use GI: difficulty swallowing, nausea, vomiting, GERD, ulcers, constipation, diarrhea, change in bowel habits, abdominal pain, liver diseaseGU: dysuria, blood in urine, nocturia, infections, kidney stonesEndocrine/Reproductive: cold intolerance, heat intolerance, miscarriageMusculoskeletal: morning stiffness, neck pain, back pain, joint pain, joint swelling, muscle aching or tenderness, muscle weaknessNeurological: numbness, tingling, headaches, fainting, dizziness, imbalance, memory loss, seizure, strokeHeme/Lymph: swollen or tender glands, anemiaPsychiatric:  anxiety, irritability, depression, sleep disturbancePast Medical HistoryPast Medical History: Diagnosis Date ? Menorrhagia  ? Migraine headache  ? Obesity (BMI 30-39.9)  ? Pelvic pain   since 2010 or 2011 ? Smoker  ? Varicose vein of leg  Current Facility-Administered Medications Medication ?  enoxaparin prophylaxis dosing ? rizatriptan ? sodium chloride ? sodium chloride Current Outpatient Medications Medication Sig ? aspirin-acetaminophen-caffeine Take 1 tablet by mouth daily as needed (for migraine). Do not exceed 2 tablets in 24 hours ? SUMAtriptan Take 2 tablets (100 mg total) by mouth daily as needed for migraine (Take as soon as migraine starts together with reglan and tylenol, no more than 3 times per week or 9 times per month). ? acetaminophen Take 650 mg by mouth every 8 (eight) hours as needed. (Patient not taking: Reported on 06/07/2020) ? albuterol sulfate Inhale 2 puffs into the lungs every 4 (four) hours as needed for shortness of breath or wheezing. (Patient not taking: Reported on 06/07/2020) ? diphenhydrAMINE-acetaminophen Take 1 tablet by mouth nightly as needed (for headache). (Patient not taking: Reported on 06/07/2020) ? AdviL Dual  Action Take 2 tablets by mouth every 8 (eight) hours as needed (for migraine). (Patient not taking: Reported on 06/07/2020) ? metoclopramide HCl Take 1 tablet (10 mg total) by mouth daily as needed (Take as soon as migraine starts together with sumatriptan and tylenol, no more than 3 times per week or 9 times per month). (Patient not taking: Reported on 06/07/2020) ? Miscellaneous Medical Supply Nebulizer machine. Use every 6 hours as needed, duration: 1 year. For asthma ICD10 J45 ? nicotine Place 1 patch onto the skin daily. (Patient not taking: Reported on 06/07/2020) Family HistoryRA: FatherLupus: AuntPsoriasis: noHypothyroidism: noRenal failure: noGout: noIBD: noOther autoimmune disease: noSocial HistoryWho is at home:Work/school: Exercise:Smoking/alcohol:Physical Exam:BP 134/84  - Pulse 81  - Temp 98.1 ?F (36.7 ?C) (Oral)  - Resp 17  - LMP 10/14/2012  - SpO2 96% Constitutional: Patient appears well, in no acute distress + obeseHEENT: Conjunctivae and sclerae normal, buccal mucosa normal without lesions + dry mouth, + xanthelasmaNeck: ROM is normal; cervical, supraclavicular lymph nodes non tender and mobile. No JVD.Back: No tenderness to palpation and normal ROMRespiratory: Clear to auscultation bilaterally. CVS: Regular rate and rhythm, S1,S2; no murmurs, gallops, or rubsGI: Normal bowel sounds; soft; non-tender; non-distended; no palpable masses.Extremities: Upper and lower extremity joints are normal without synovitis or limitation in ROM; no edema or clubbing; pulses 2+ and symmetricGU: Deferred Skin: Skin color, texture, turgor normal, no rashes or lesionsNeuro: Alert and oriented, grossly intactPsychiatric: Mood/affect appropriate, well-kempt Labs and Imaging:Lab Results Component Value Date  WBC 10.6 06/06/2020  HGB 14.3 06/06/2020  HCT 43.60 06/06/2020  MCV 89.9 06/06/2020  MCH 29.5 06/06/2020  MCHC 32.8 06/06/2020  PLT 335 06/06/2020  MPV 9.5 06/06/2020  NEUTROPHILS 68.8 06/06/2020  LYMPHOCYTES 21.2 06/06/2020  MONOCYTES 6.6 06/06/2020  EOSINOPHILS 2.5 06/06/2020  Lab Results Component Value Date  NA 141 06/06/2020  K 4.2 06/06/2020  CL 101 06/06/2020  CO2 27 06/06/2020  GLU 95 06/06/2020  BUN 10 06/06/2020  CREATININE 1.10 06/06/2020  EGFR >60 09/28/2012  EGFRAFRAMER >60 06/06/2020  CALCIUM 9.6 06/06/2020  ALBUMIN 3.9 10/08/2018  PROT 7.2 10/08/2018  BILITOT 0.2 10/19/2018  ALKPHOS 77 10/19/2018  ALT 55 (H) 10/19/2018  AST 34 (H) 10/19/2018  GLOB 3.3 10/08/2018  Lab Results Component Value Date  HSCRP 48.6 (H) 06/05/2020  SEDRATE 39 (H) 06/05/2020 ESR 39CRP 48.6NON CONTRAST [place] HEAD, CTA HEAD NECK WITH CONTRAST?HISTORY: Vision loss, monocular. ?TECHNIQUE:  Noncontrast [place] of the head was performed followed by [place] angiography of the head and neck was performed with 3D/MIP reconstructions for evaluation of the cervical vasculature as well as the circle of Willis.  Vascular measurements of stenosis were made according to the NASCET criteria. 60 cc of Omnipaque 350 was administered intravenously.?COMPARISON: Brain MRI from earlier today and head [place] 07/21/2003?FINDINGS:? HEAD WITHOUT CONTRAST:No acute territorial infarction. 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 No extra-axial fluid collection.No mass effect or midline shift. The basal cisterns and foramen magnum are patent.No hydrocephalus.?Partially empty sella, nonspecific.?The visualized orbits and soft tissues are unremarkable.No aggressive osseous lesion or fracture.Mild mucosal thickening throughout the right anterior ethmoidal air cells.The mastoid air cells are aerated.?CTA NECK:?Aortic arch: There is a two-vessel aortic arch, the brachiocephalic trunk and left common carotid artery share a common origin. The brachiocephalic and bilateral subclavian arteries are patent.?Right Carotid Artery: Patent without atherosclerotic disease.?Left Carotid Artery: Patent without atherosclerotic disease.?Vertebral Arteries: Patent without atherosclerotic disease.?No evidence of carotid or vertebral artery dissection or pseudoaneurysm.?Nonspecific prominence of the palatine  tonsils.No aggressive osseous lesion.Straightening of the cervical lordosis, with prominent Schmorl's nodes and sclerotic endplate changes at C5-C6.A few nonspecific subcentimeter thyroid hypodense nodules noted on the right.?The visualized lung apices are clear.?CTA HEAD:The intracranial internal carotid arteries and major branches of the middle and anterior cerebral arteries are patent. There is atherosclerotic disease through out the carotid siphons bilaterally not causing any significant stenosis. ?The intracranial vertebral arteries, basilar artery, superior cerebellar arteries, and posterior cerebral arteries are patent.?There is no arterial occlusion. No significant intracranial atherosclerotic disease. No aneurysm or vascular malformation.?IMPRESSION:?[place] head: No acute intracranial abnormality.?CTA Neck: No hemodynamically significant stenosis, dissection, or pseudoaneurysm.?CTA Head: No arterial occlusion, significant stenosis, aneurysm, or vascular malformation.?Report Initiated By:  Willette Alma, MD?Reported And Signed By: Channing Mutters, MD  [facility] Radiology and Biomedical ImagingMRI ORBITS W WO IV CONTRAST, MRI BRAIN W WO IV CONTRAST performed on 06/06/2020 4:56 PM?PROVIDED INDICATION: Optic neuritis suspected. ?ADDITIONAL HISTORY OBTAINED FROM ELECTRONIC MEDICAL RECORD NUMBERBlurred central vision in the right eye with floaters for 2 months and elevated CRP. History of migraine headaches. 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 ?FINDINGS:Images are degraded by patient motion artifact.?MRI BRAIN:There is no intracranial hemorrhage.There is no major vascular distribution infarct. 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 No acute abnormality in the brain or orbits. No definite evidence of optic neuritis.??Report Initiated By:  Kara Mead, MD?Reported And Signed By: Deno Lunger, MD  [facility] Radiology and Biomedical ImagingYale Rheumatology Quality Measures:Disease Monitoring  Medication Safety DEXA:PFT:Echo: [id number]: (provider/ last exam) Immunizations  Preventative Care Immunization History Administered Date(s) Administered ? Pneumococcal polysaccharide PPV23 12/31/2012  Cardiovascular Lipids:No results found for: CHOL, TRIG, HDL, LDL There is no height or weight on file to calculate BMI.Women's Health    OB/GYN:    Contraception:    YQM:VHQION Screening    Colonoscopy:    Mammogram: *In immunosuppressed adults 19-64 years, administer 1 dose of PCV13 (Prevnar). Administer 1st dose of PPSV23(Pneumovax) at least 8 weeks later and administer 2nd dose of PPSV23 at least 5 years later. Administer 1 final dose of PPSV23 at 65 years, at least 5 years after the most recent dose. RAPID 3No flowsheet data found.Assessment and Plan:Alichia M Thiya Agudelo is a 54 y.o. female with past medical history significant for migraines presents with 1 month history of painless vision loss.  She started to have loss of vision in right eye when she was in [place]. She was noted to have elevated CRP and rheumatology consulted to rule out GCA.# Vision loss in right eye, rule out GCA- ESR and CRP mildly elevated (non-specific markers); patient denies any headaches, jaw claudication, fevers, alopecia, dry eyes/mouth, mouth ulcers, arthralgias, or weight changes; patient has risk factors for atherosclerotic disease (obese, smoker, xanthelasma?); can check serologies to determine if there is an autoimmune or vasculitic etiology; recommend team to rule out an atherosclerotic etiology.- Please order ANCA, MPO, Pr3, antiphospholipid, ACE, ANA, SSA, SSB, C3, C4, RF, CCP, HgBA1C, lipid panel- Agree with obtaining temporal artery [place] (  rule out GCA) and chest-xray (rule out sarcoid).- 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 Roger Shelter.  Recommendations are not final.  Please see attending addendum.Dawayne Cirri, MDRheumatology fellow

## 2020-06-08 NOTE — Other
[facility]  Neurology ED Consult Note     Primary Care Doctor: Walden Field  Reason for Consult:   Painless R eye vision loss    HPI:   Janet Glenn is a 54 y.o. woman with PMH of obesity per BMI, current smoker, and migraines who presents with 5-6 weeks of painless vision loss in her right eye.    Pt states that about 5-6 weeks ago she woke up with loss of vision in her right eye. At that time she was able only to see flashes of light with the right eye, but no colors or shapes. Over the course of the several weeks her vision has improved to the point that she is now able to see colors, shapes, and most objects, though her visual acuity remains low. Yesterday 11/9 she was seen by outpatient ophthalmology, who found right optic nerve pallor and 20/70 right visual acuity and recommended presentation to the ED for evaluation.    In the ED, pt underwent MRI brain + orbits w and wo, which were motion degraded but showed a chronic right BG infarct without evidence of acute abnormality or signs of optic neuritis. CTA was unremarkable. ESR and CRP elevated.     Pt denies worsened headaches, LOC, focal weakness, and new paresthesias. Of note, her father died at [facility] and she has significant medical trauma in that setting.     ROS:   Negative except as reviewed above in HPI.    History:     Past Medical History:  Past Medical History:   Diagnosis Date   ? Menorrhagia    ? Migraine headache    ? Obesity (BMI 30-39.9)    ? Pelvic pain     since 2010 or 2011   ? Smoker    ? Varicose vein of leg     Family History:  Family History   Problem Relation Age of Onset   ? Prostate cancer Father         metastasis to bone   ? Breast cancer Neg Hx    ? Colon cancer Neg Hx    ? Ovarian cancer Neg Hx       Past Surgical History:  Past Surgical History:   Procedure Laterality Date   ? BACK SURGERY     ? HYSTERECTOMY     ? LEFT OOPHORECTOMY      laparoscopy, ovarian torsion   ? LUMBAR FUSION  09/11/2005    Social History:  Social History     Tobacco Use   ? Smoking status: Current Every Day Smoker     Packs/day: 1.50     Years: 35.00     Pack years: 52.50   ? Tobacco comment: recently on nicotene patch   still has about 2 cigaretters per day   Substance Use Topics   ? Alcohol use: No        Allergies:  Allergies as of 06/07/2020 - Review Complete 06/07/2020   Allergen Reaction Noted   ? Fish containing products Anaphylaxis 12/24/2012   ? Shellfish containing products Anaphylaxis 12/24/2012   ? Contrast dye [iodinated contrast media] Itching 09/28/2012   ? Penicillins - tolerates cephalosporins Rash 09/28/2012     Vitals:   Temp:  [98 ?F (36.7 ?C)-98.1 ?F (36.7 ?C)] 98.1 ?F (36.7 ?C)  Pulse:  [81-92] 81  Resp:  [17-20] 17  BP: (117-148)/(78-93) 134/84  SpO2:  [96 %-99 %] 96 %  Device (Oxygen Therapy): room air  Home Medications:      Marland Kitchen    Medication Sig Taking?   aspirin-acetaminophen-caffeine (EXCEDRIN MIGRAINE) 250-250-65 mg per tablet Take 1 tablet by mouth daily as needed (for migraine). Do not exceed 2 tablets in 24 hours Yes   SUMAtriptan (IMITREX) 50 mg tablet Take 2 tablets (100 mg total) by mouth daily as needed for migraine (Take as soon as migraine starts together with reglan and tylenol, no more than 3 times per week or 9 times per month). Yes   acetaminophen (TYLENOL) 650 mg CR tablet Take 650 mg by mouth every 8 (eight) hours as needed.  Patient not taking: Reported on 06/07/2020    albuterol sulfate 90 mcg/actuation HFA aerosol inhaler Inhale 2 puffs into the lungs every 4 (four) hours as needed for shortness of breath or wheezing.  Patient not taking: Reported on 06/07/2020    diphenhydrAMINE-acetaminophen (TYLENOL PM) 25-500 mg Tab Take 1 tablet by mouth nightly as needed (for headache).  Patient not taking: Reported on 06/07/2020    ibuprofen-acetaminophen (ADVIL DUAL ACTION) 125-250 mg Tab Take 2 tablets by mouth every 8 (eight) hours as needed (for migraine).  Patient not taking: Reported on 06/07/2020 metoclopramide HCl (REGLAN) 10 mg tablet Take 1 tablet (10 mg total) by mouth daily as needed (Take as soon as migraine starts together with sumatriptan and tylenol, no more than 3 times per week or 9 times per month).  Patient not taking: Reported on 06/07/2020    Miscellaneous Medical Supply Nebulizer machine. Use every 6 hours as needed, duration: 1 year. For asthma ICD10 J45    nicotine (NICODERM CQ) 21 mg transdermal patch Place 1 patch onto the skin daily.  Patient not taking: Reported on 06/07/2020      Current Medications:     Scheduled Meds:   Current Facility-Administered Medications   Medication Dose Route Frequency Provider Last Rate Last Admin   ? enoxaparin (LOVENOX) syringe 40 mg  40 mg Subcutaneous Q12H Lacie Draft, MD       ? sodium chloride 0.9 % flush 3 mL  3 mL IV Push Q8H Lacie Draft, MD            Neurologic Exam:     GEN: Non-toxic-appearing adult woman in NAD  MENTAL STATUS: awake and alert, oriented to self, date, location, and situation. Attention and comprehension intact. Speech fluent and appropriate without dysarthria. Naming and repetition intact.  CRANIAL NERVES: PERRL, EOMI without nystagmus, diminished visual acuity in right eye but intact to peripheral fingfer counting in bilateral visual fields, V1-V3 intact to light touch, no facial droop, T/U/P midline, trapezius strength 5/5  MOTOR: Normal bulk, no abnormal movements, no drift. Strength 5/5 throughout with exception of chronic left foot drop (3/5 dorsiflexion and plantarflexion).  COORDINATION: Finger-nose-finger intact without dysmetria bilaterally.  SENSORY: Intact to light touch in bilateral UE and LE throughout.  GAIT: Deferred     Laboratory Data:     CBC w/ diff  Recent Labs   Lab 06/05/20  1657 06/05/20  1657 06/06/20  1441   WBC 9.7  --  10.6   HGB 14.7   < > 14.3   HCT 47.6  --  43.60   PLT 388   < > 335   MCV 92.6   < > 89.9   NEUTROPHILS 66.4   < > 68.8    < > = values in this interval not displayed. BMP  Recent Labs   Lab 06/05/20  1657 06/05/20  1657 06/06/20  1441   NA 142  --  141   K 4.5   < > 4.2   CL 101   < > 101   CO2 29   < > 27   BUN 11   < > 10   CREATININE 1.20   < > 1.10   ANIONGAP 12   < > 13    < > = values in this interval not displayed.      Divalents  Recent Labs   Lab 06/05/20  1657 06/06/20  1441   CALCIUM 10.1 9.6      Coags  Recent Labs   Lab 06/05/20  1657   PTT 28.5   INR 0.95        ESR: 39  CRP: 48.6    Imaging:   CTA Head Neck w and/or wo IV Contrast  Result Date: 06/06/2020  [place] head: No acute intracranial abnormality. CTA Neck: No hemodynamically significant stenosis, dissection, or pseudoaneurysm. CTA Head: No arterial occlusion, significant stenosis, aneurysm, or vascular malformation. Report Initiated By:  Willette Alma, MD Reported And Signed By: Channing Mutters, MD  [facility] Radiology and Biomedical Imaging    MRI Brain w wo IV Contrast and MRI Orbits w wo IV Contrast  Result Date: 06/06/2020  Motion degraded study. 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 No acute abnormality in the brain or orbits. No definite evidence of optic neuritis. Report Initiated By:  Kara Mead, MD Reported And Signed By: Deno Lunger, MD  [facility] Radiology and Biomedical Imaging    A&P:   Janet Glenn is a 54 y.o. woman with PMH of obesity per BMI, current smoker, and migraines who presents with 5-6 weeks of painless vision loss in her right eye.    DDx for pt's several weeks of right eye vision loss includes ischemic optic neuropathy vs possible GCA. Ischemic neuropathy is most likely in the setting of her vascular risk factors and current smoking status, however given her ESR and CRP elevations and demographics she should undergo further workup for possible GCA. Optic neuritis is unlikely given her painless presentation and MRI without evidence of optic nerve enhancement.    - obtain temporal artery U/S  - recommend consulting rheumatology for input on possible GCA  - Neurology consult service will continue to follow    Patient discussed with attending Neurologist Dr. Park Meo; recommendations are not final. Attending attestation/co-signature to follow.    SIGNED:  Holli Humbles, MD  Neurology PGY-3  06/07/2020   1:33 PM
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
## 2020-07-13 ENCOUNTER — Telehealth: Admit: 2020-07-13 | Payer: PRIVATE HEALTH INSURANCE | Attending: Ophthalmology | Primary: Internal Medicine

## 2020-07-13 NOTE — Telephone Encounter
[facility] OPH DR. Estill Dooms 3/7 @1 :3012/16 Lvm informing pt of cx'd appt

## 2020-10-02 ENCOUNTER — Encounter: Admit: 2020-10-02 | Payer: MEDICAID | Attending: Ophthalmology | Primary: Internal Medicine

## 2020-11-16 ENCOUNTER — Encounter: Admit: 2020-11-16 | Payer: MEDICAID | Attending: Ophthalmology | Primary: Internal Medicine

## 2021-03-16 ENCOUNTER — Encounter: Admit: 2021-03-16 | Payer: PRIVATE HEALTH INSURANCE | Primary: Internal Medicine

## 2021-04-09 ENCOUNTER — Encounter: Admit: 2021-04-09 | Payer: MEDICAID | Attending: Ophthalmology | Primary: Internal Medicine
